# Patient Record
Sex: Male | Born: 1990 | Race: Black or African American | Hispanic: No | Marital: Single | Smoking: Never smoker
Health system: Southern US, Community
[De-identification: ages and names within clinical notes are randomized; demographics above are authoritative.]

## PROBLEM LIST (undated history)

## (undated) HISTORY — PX: WISDOM TOOTH EXTRACTION: SHX21

---

## 2020-03-31 ENCOUNTER — Encounter (HOSPITAL_COMMUNITY): Payer: Self-pay

## 2020-03-31 ENCOUNTER — Ambulatory Visit (INDEPENDENT_AMBULATORY_CARE_PROVIDER_SITE_OTHER): Payer: Self-pay

## 2020-03-31 ENCOUNTER — Ambulatory Visit (HOSPITAL_COMMUNITY)
Admission: EM | Admit: 2020-03-31 | Discharge: 2020-03-31 | Disposition: A | Discharge status: Home / Self Care | Payer: Self-pay | Attending: Family Medicine | Admitting: Family Medicine

## 2020-03-31 DIAGNOSIS — M25531 Pain in right wrist: Secondary | ICD-10-CM

## 2020-03-31 MED ORDER — MELOXICAM 15 MG PO TABS: 15.0000 mg | ORAL_TABLET | Freq: Every day | ORAL | 1 refills | Status: AC
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
## 2020-03-31 NOTE — Discharge Instructions (Addendum)
You can purchase an over-the-counter wrist sleeve for compression and reduce inflammation which is causing swelling. I have also prescribed meloxicam 15 mg once daily as needed for wrist pain. I would recommend icing and elevation to reduce swelling. I have provided contact information for [facility] orthopedic office if symptoms worsen or do not improve.
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
